# Patient Record
Sex: Female | Born: 1990 | Race: Black or African American | Hispanic: No | Marital: Single | Smoking: Never smoker
Health system: Southern US, Community
[De-identification: ages and names within clinical notes are randomized; demographics above are authoritative.]

## PROBLEM LIST (undated history)

## (undated) DIAGNOSIS — D219 Benign neoplasm of connective and other soft tissue, unspecified: Secondary | ICD-10-CM

---

## 2018-04-04 ENCOUNTER — Emergency Department (HOSPITAL_COMMUNITY): Payer: Self-pay

## 2018-04-04 ENCOUNTER — Emergency Department (HOSPITAL_COMMUNITY)
Admission: EM | Admit: 2018-04-04 | Discharge: 2018-04-04 | Disposition: A | Discharge status: Home / Self Care | Payer: Self-pay | Attending: Emergency Medicine | Admitting: Emergency Medicine

## 2018-04-04 ENCOUNTER — Encounter (HOSPITAL_COMMUNITY): Payer: Self-pay | Admitting: Emergency Medicine

## 2018-04-04 DIAGNOSIS — R112 Nausea with vomiting, unspecified: Secondary | ICD-10-CM

## 2018-04-04 DIAGNOSIS — R102 Pelvic and perineal pain: Secondary | ICD-10-CM | POA: Insufficient documentation

## 2018-04-04 DIAGNOSIS — N760 Acute vaginitis: Secondary | ICD-10-CM | POA: Insufficient documentation

## 2018-04-04 DIAGNOSIS — N83201 Unspecified ovarian cyst, right side: Secondary | ICD-10-CM

## 2018-04-04 DIAGNOSIS — B9689 Other specified bacterial agents as the cause of diseases classified elsewhere: Secondary | ICD-10-CM

## 2018-04-04 DIAGNOSIS — N83291 Other ovarian cyst, right side: Secondary | ICD-10-CM | POA: Insufficient documentation

## 2018-04-04 LAB — COMPREHENSIVE METABOLIC PANEL
ALT: 20 U/L (ref 0–44)
AST: 24 U/L (ref 15–41)
Albumin: 4.2 g/dL (ref 3.5–5.0)
Alkaline Phosphatase: 21 U/L — ABNORMAL LOW (ref 38–126)
Anion gap: 9 (ref 5–15)
BILIRUBIN TOTAL: 0.4 mg/dL (ref 0.3–1.2)
BUN: 13 mg/dL (ref 6–20)
CHLORIDE: 105 mmol/L (ref 98–111)
CO2: 26 mmol/L (ref 22–32)
CREATININE: 0.95 mg/dL (ref 0.44–1.00)
Calcium: 9 mg/dL (ref 8.9–10.3)
GFR calc non Af Amer: >60 mL/min (ref >60)
Glucose, Bld: 93 mg/dL (ref 70–99)
POTASSIUM: 3.7 mmol/L (ref 3.5–5.1)
Sodium: 140 mmol/L (ref 135–145)
Total Protein: 7.6 g/dL (ref 6.5–8.1)

## 2018-04-04 LAB — URINALYSIS, ROUTINE W REFLEX MICROSCOPIC
BILIRUBIN URINE: NEGATIVE
GLUCOSE, UA: NEGATIVE mg/dL
HGB URINE DIPSTICK: NEGATIVE
KETONES UR: NEGATIVE mg/dL
Leukocytes, UA: NEGATIVE
Nitrite: NEGATIVE
PH: 7 (ref 5.0–8.0)
Protein, ur: NEGATIVE mg/dL
SPECIFIC GRAVITY, URINE: 1.02 (ref 1.005–1.030)

## 2018-04-04 LAB — WET PREP, GENITAL
SPERM: NONE SEEN
Trich, Wet Prep: NONE SEEN
Yeast Wet Prep HPF POC: NONE SEEN

## 2018-04-04 LAB — CBC
HEMATOCRIT: 43.4 % (ref 36.0–46.0)
HEMOGLOBIN: 13.3 g/dL (ref 12.0–15.0)
MCH: 26.9 pg (ref 26.0–34.0)
MCHC: 30.6 g/dL (ref 30.0–36.0)
MCV: 87.9 fL (ref 80.0–100.0)
Platelets: 278 10*3/uL (ref 150–400)
RBC: 4.94 MIL/uL (ref 3.87–5.11)
RDW: 13.3 % (ref 11.5–15.5)
WBC: 13.4 10*3/uL — AB (ref 4.0–10.5)
nRBC: 0 % (ref 0.0–0.2)

## 2018-04-04 LAB — I-STAT BETA HCG BLOOD, ED (MC, WL, AP ONLY): I-stat hCG, quantitative: <5 m[IU]/mL (ref <5)

## 2018-04-04 LAB — LIPASE, BLOOD: LIPASE: 46 U/L (ref 11–51)

## 2018-04-04 MED ORDER — TRAMADOL HCL 50 MG PO TABS: 50.0000 mg | ORAL_TABLET | Freq: Four times a day (QID) | ORAL | 0 refills | Status: AC | PRN

## 2018-04-04 MED ORDER — ONDANSETRON 4 MG PO TBDP: 4.0000 mg | ORAL_TABLET | Freq: Three times a day (TID) | ORAL | 0 refills | Status: DC | PRN

## 2018-04-04 MED ORDER — METRONIDAZOLE 500 MG PO TABS: 500.0000 mg | ORAL_TABLET | Freq: Two times a day (BID) | ORAL | 0 refills | Status: DC

## 2018-04-04 MED ORDER — OXYCODONE-ACETAMINOPHEN 5-325 MG PO TABS
1.0000 | ORAL_TABLET | Freq: Once | ORAL | Status: AC
  Administered 2018-04-04: 1 via ORAL
  Filled 2018-04-04: qty 1

## 2018-04-04 NOTE — ED Triage Notes (Signed)
Per EMS, pt called for abdominal pain that started a few hours ago.  Pt reports emesis "for the past week, comes and goes."

## 2018-04-04 NOTE — ED Notes (Signed)
EDP at bedside  

## 2018-04-04 NOTE — ED Provider Notes (Signed)
[facility] Provider Note   CSN: [id number] Arrival date & time: 04/04/18  0342     History   Chief Complaint Chief Complaint  Patient presents with  . Abdominal Pain    HPI Kristine Barnett is a 27 y.o. female here for evaluation of abdominal pain.  Pain is described as sharp, cramping, severe.  Pain is localized to the lower abdomen initially worse in the right side but now diffusely all over in the lower abdomen, pain radiates and shoots into the vagina.  Pain is worse when she moves or sits up.  Pain began 5 minutes after having sexual intercourse, approximately 3 hours prior to arrival.  Associated symptoms include nausea and nonbilious nonbloody intermittent vomiting for 1 week.  History of urinary frequency for a long time.  Additionally, has noticed increased thicker vaginal discharge with no odor.  No previous similar pains in the past.  LMP September 15.  She is sexually active with one female partner without condoms.  History of STDs many years ago.  Denies associated fevers, chills, hematemesis, changes in bowel movements, blood in stool, hematuria, dysuria.  No previous abdominal or pelvic surgeries.  No interventions PTA.  Alleviated by sitting still, not moving.   HPI  History reviewed. No pertinent past medical history.  There are no active problems to display for this patient.   History reviewed. No pertinent surgical history.   OB History   None      Home Medications    Prior to Admission medications   Medication Sig Start Date End Date Taking? Authorizing Provider  metroNIDAZOLE (FLAGYL) 500 MG tablet Take 1 tablet (500 mg total) by mouth 2 (two) times daily. 04/04/18   Kinnie Feil, PA-C  ondansetron (ZOFRAN ODT) 4 MG disintegrating tablet Take 1 tablet (4 mg total) by mouth every 8 (eight) hours as needed for nausea or vomiting. 04/04/18   Kinnie Feil, PA-C  traMADol (ULTRAM) 50 MG tablet Take 1 tablet (50  mg total) by mouth every 6 (six) hours as needed for up to 2 days. 04/04/18 04/06/18  Kinnie Feil, PA-C    Family History No family history on file.  Social History Social History   Tobacco Use  . Smoking status: Not on file  Substance Use Topics  . Alcohol use: Not on file  . Drug use: Not on file     Allergies   Patient has no allergy information on record.   Review of Systems Review of Systems  Gastrointestinal: Positive for abdominal pain, nausea and vomiting.  Genitourinary: Positive for dyspareunia, vaginal discharge and vaginal pain.  All other systems reviewed and are negative.    Physical Exam Updated Vital Signs BP 104/69 (BP Location: Right Arm)   Pulse 74   Temp 98.7 F (37.1 C) (Oral)   Resp 16   Ht 5\' 6"  (1.676 m)   SpO2 94%   Physical Exam  Constitutional: She is oriented to person, place, and time. She appears well-developed and well-nourished.  Non toxic. Looks uncomfortable, complains of pain with movement and sitting up.   HENT:  Head: Normocephalic and atraumatic.  Nose: Nose normal.  Eyes: Pupils are equal, round, and reactive to light. Conjunctivae and EOM are normal.  Neck: Normal range of motion.  Cardiovascular: Normal rate and regular rhythm.  Pulmonary/Chest: Effort normal and breath sounds normal.  Abdominal: Soft. Bowel sounds are normal. There is tenderness.  Moderate tenderness to very low abdomen worse on  R, suprapubic region. No G/R/R. Active bowel sounds to lower quadrants. Negative Murphy's and Mcburney's. No CVAT.   Genitourinary: Cervix exhibits motion tenderness. Right adnexum displays tenderness.  Genitourinary Comments:  RN at bedside during exam External genitalia normal without erythema, tenderness, discharge or lesions.  No groin lymphadenopathy.  Vaginal mucosa pink without lesions, injury or bleeding.  Cervix is closed w/o friability and only scant amount of white/gray watery discharge, slight fishy odor  noted.  Mild CMT and R adnexal tenderness w/o fullness. Non palpable, non tender L adnexa. Perianal skin normal  Musculoskeletal: Normal range of motion.  Neurological: She is alert and oriented to person, place, and time.  Skin: Skin is warm and dry. Capillary refill takes less than 2 seconds.  Psychiatric: She has a normal mood and affect. Her behavior is normal.  Nursing note and vitals reviewed.    ED Treatments / Results  Labs (all labs ordered are listed, but only abnormal results are displayed) Labs Reviewed  WET PREP, GENITAL - Abnormal; Notable for the following components:      Result Value   Clue Cells Wet Prep HPF POC PRESENT (*)    WBC, Wet Prep HPF POC MODERATE (*)    All other components within normal limits  COMPREHENSIVE METABOLIC PANEL - Abnormal; Notable for the following components:   Alkaline Phosphatase 21 (*)    All other components within normal limits  CBC - Abnormal; Notable for the following components:   WBC 13.4 (*)    All other components within normal limits  LIPASE, BLOOD  URINALYSIS, ROUTINE W REFLEX MICROSCOPIC  I-STAT BETA HCG BLOOD, ED ([facility], [facility], [facility] ONLY)  GC/CHLAMYDIA PROBE AMP ([place]) NOT AT [facility]    EKG None  Radiology US Transvaginal Non-ob  Result Date: 04/04/2018 CLINICAL DATA:  pain/dyspareunia EXAM: TRANSABDOMINAL AND TRANSVAGINAL ULTRASOUND OF PELVIS DOPPLER ULTRASOUND OF OVARIES TECHNIQUE: Study was performed transabdominally to optimize pelvic field of view evaluation and transvaginally to optimize internal visceral architecture evaluation. Color and duplex Doppler ultrasound was utilized to evaluate blood flow to the ovaries. COMPARISON:  None. FINDINGS: Uterus Measurements: 8.7 x 3.9 x 5.3 cm. No fibroids or other mass visualized. Endometrium Thickness: 15 mm.  No focal abnormality visualized. Right ovary Measurements: 6.1 x 3.5 x 3.9 cm. There is a somewhat complex right ovarian mass measuring 2.5 x 1.9 x 2.5 cm. There is  also a simple cyst arising from the right ovary, a likely follicle, measuring 2.5 x 1.9 x 2.5 cm. Left ovary Measurements: 3.4 x 2.6 x 3.1 cm. Normal appearance/no adnexal mass. Pulsed Doppler evaluation of both ovaries demonstrates normal low-resistance arterial and venous waveforms. Other findings Small amount of free fluid. IMPRESSION: 1. Complex right ovarian region mass measuring 2.5 x 1.9 x 2.5 cm. Suspect hemorrhagic cyst. Short-interval follow up ultrasound in 6-12 weeks is recommended, preferably during the week following the patient's normal menses. Probable dominant follicle also noted on the right. 2.  Normal appearing left ovary. 3. Small amount of free pelvic fluid. Question recent ovarian cyst leakage/rupture. 4. Doppler signal noted in each ovary. There are no findings which are felt to be indicative of acute ovarian torsion. 5.  Uterus and endometrium appear within normal limits for age. Electronically Signed   By: Lowella Grip III M.D.   On: 04/04/2018 08:37   US Pelvis Complete  Result Date: 04/04/2018 CLINICAL DATA:  pain/dyspareunia EXAM: TRANSABDOMINAL AND TRANSVAGINAL ULTRASOUND OF PELVIS DOPPLER ULTRASOUND OF OVARIES TECHNIQUE: Study was performed transabdominally to  optimize pelvic field of view evaluation and transvaginally to optimize internal visceral architecture evaluation. Color and duplex Doppler ultrasound was utilized to evaluate blood flow to the ovaries. COMPARISON:  None. FINDINGS: Uterus Measurements: 8.7 x 3.9 x 5.3 cm. No fibroids or other mass visualized. Endometrium Thickness: 15 mm.  No focal abnormality visualized. Right ovary Measurements: 6.1 x 3.5 x 3.9 cm. There is a somewhat complex right ovarian mass measuring 2.5 x 1.9 x 2.5 cm. There is also a simple cyst arising from the right ovary, a likely follicle, measuring 2.5 x 1.9 x 2.5 cm. Left ovary Measurements: 3.4 x 2.6 x 3.1 cm. Normal appearance/no adnexal mass. Pulsed Doppler evaluation of both ovaries  demonstrates normal low-resistance arterial and venous waveforms. Other findings Small amount of free fluid. IMPRESSION: 1. Complex right ovarian region mass measuring 2.5 x 1.9 x 2.5 cm. Suspect hemorrhagic cyst. Short-interval follow up ultrasound in 6-12 weeks is recommended, preferably during the week following the patient's normal menses. Probable dominant follicle also noted on the right. 2.  Normal appearing left ovary. 3. Small amount of free pelvic fluid. Question recent ovarian cyst leakage/rupture. 4. Doppler signal noted in each ovary. There are no findings which are felt to be indicative of acute ovarian torsion. 5.  Uterus and endometrium appear within normal limits for age. Electronically Signed   By: Lowella Grip III M.D.   On: 04/04/2018 08:37   [place] Art/ven Flow Abd Pelv Doppler  Result Date: 04/04/2018 CLINICAL DATA:  pain/dyspareunia EXAM: TRANSABDOMINAL AND TRANSVAGINAL ULTRASOUND OF PELVIS DOPPLER ULTRASOUND OF OVARIES TECHNIQUE: Study was performed transabdominally to optimize pelvic field of view evaluation and transvaginally to optimize internal visceral architecture evaluation. Color and duplex Doppler ultrasound was utilized to evaluate blood flow to the ovaries. COMPARISON:  None. FINDINGS: Uterus Measurements: 8.7 x 3.9 x 5.3 cm. No fibroids or other mass visualized. Endometrium Thickness: 15 mm.  No focal abnormality visualized. Right ovary Measurements: 6.1 x 3.5 x 3.9 cm. There is a somewhat complex right ovarian mass measuring 2.5 x 1.9 x 2.5 cm. There is also a simple cyst arising from the right ovary, a likely follicle, measuring 2.5 x 1.9 x 2.5 cm. Left ovary Measurements: 3.4 x 2.6 x 3.1 cm. Normal appearance/no adnexal mass. Pulsed Doppler evaluation of both ovaries demonstrates normal low-resistance arterial and venous waveforms. Other findings Small amount of free fluid. IMPRESSION: 1. Complex right ovarian region mass measuring 2.5 x 1.9 x 2.5 cm. Suspect hemorrhagic  cyst. Short-interval follow up ultrasound in 6-12 weeks is recommended, preferably during the week following the patient's normal menses. Probable dominant follicle also noted on the right. 2.  Normal appearing left ovary. 3. Small amount of free pelvic fluid. Question recent ovarian cyst leakage/rupture. 4. Doppler signal noted in each ovary. There are no findings which are felt to be indicative of acute ovarian torsion. 5.  Uterus and endometrium appear within normal limits for age. Electronically Signed   By: Lowella Grip III M.D.   On: 04/04/2018 08:37    Procedures Procedures (including critical care time)  Medications Ordered in ED Medications  oxyCODONE-acetaminophen (PERCOCET/ROXICET) 5-325 MG per tablet 1 tablet (1 tablet Oral Given 04/04/18 0730)     Initial Impression / Assessment and Plan / ED Course  I have reviewed the triage vital signs and the nursing notes.  Pertinent labs & imaging results that were available during my care of the patient were reviewed by me and considered in my medical decision making (see chart  for details).  Clinical Course as of Apr 04 904  Fri Apr 04, 2018  2841 IMPRESSION: 1. Complex right ovarian region mass measuring 2.5 x 1.9 x 2.5 cm. Suspect hemorrhagic cyst. Short-interval follow up ultrasound in 6-12 weeks is recommended, preferably during the week following the patient's normal menses. Probable dominant follicle also noted on the right.  2. Normal appearing left ovary.  3. Small amount of free pelvic fluid. Question recent ovarian cyst leakage/rupture.  4. Doppler signal noted in each ovary. There are no findings which are felt to be indicative of acute ovarian torsion.  5. Uterus and endometrium appear within normal limits for age.  US Pelvis Complete [CG]  0857 Clue Cells Wet Prep HPF POC(!): PRESENT [CG]    Clinical Course User Index [CG] Kinnie Feil, PA-C    Highest on ddx is GU etiology such as ovarian cyst  versus PID versus ectopic pregnancy.  Given description of pain on the right side also considering appendicitis however this is less likely as patient has had nausea and vomiting but no abdominal pain for a week and appendicitis would be unlikely for such a long time.  Pain actually began immediately after sexual intercourse which suggest more of a pelvic etiology.  Doubt diverticulitis.   0900: WB 313.4.  Moderate WBC and clue cells present on wet prep.  Negative pregnancy test.  Pelvic exam as above with very mild CMT and more moderate right adnexal tenderness without fullness.  No intravaginal signs of injury, bleed.  Positive whiff test suggesting BV.  Pelvic ultrasound shows a complex right ovarian mass with surrounding free pelvic fluid suggestive of hemorrhagic cyst.  This can explain patient's symptoms and fits clinical picture.  Reevaluated patient and her abdominal pain is mostly lower, pelvic, much lower than at McBurney's point.  Given clinical picture, exam, ultrasound I think appendicitis is unlikely.  I explained results to patient and that appendicitis and other GI etiology is unlikely.  She verbalized understanding and also agrees.  She has had no fevers, BM changes, urinary symptoms otherwise.  At this time, will discharge with symptomatic management of pelvic pain likely secondary to ruptured hemorrhagic cyst, Zofran and Flagyl for BV.  I discussed specific return precautions that would suggest more of a GI etiology including appendicitis and kidney stone and patient verbalized understanding.  She is trying to get pregnant and she is aware she needs to follow-up with repeat ultrasound in 6 to 8 weeks to monitor of that right ovarian mass/cyst.  Follow-up with OB/GYN.  Final Clinical Impressions(s) / ED Diagnoses   Final diagnoses:  Pelvic pain  Hemorrhagic cyst of right ovary  Nausea and vomiting in adult  Bacterial vaginosis    ED Discharge Orders         Ordered    traMADol  (ULTRAM) 50 MG tablet  Every 6 hours PRN     04/04/18 0854    ondansetron (ZOFRAN ODT) 4 MG disintegrating tablet  Every 8 hours PRN     04/04/18 0854    metroNIDAZOLE (FLAGYL) 500 MG tablet  2 times daily     04/04/18 0858           Kinnie Feil, PA-C 04/04/18 3244    Ripley Fraise, MD 04/05/18 0002

## 2018-04-04 NOTE — Discharge Instructions (Addendum)
You were seen in the ER for sudden pelvic pain.  Ultrasound showed a small "somewhat complex" right ovarian mass favored to be a recently ruptured hemorrhagic cyst.  Radiology is recommending repeat ultrasound in 6 to 12 weeks to evaluate for changes.  I suspect your symptoms are from a recently ruptured cyst in your right ovary.  We will treat this with 1000 mg of acetaminophen and/or 600 mg of ibuprofen every 6-8 hours for pain.  Take tramadol 50 mg only for breakthrough or severe pain.  Tramadol can cause constipation and mild nausea, can take with food and stool softener to avoid side effects.  We discussed the possibility of appendicitis or kidney stone is unlikely.  Monitor your symptoms.  Return to the ER if there is worsening localized pain to the right low abdomen, persistent nausea, vomiting, fevers, chills, urinary symptoms.    Swabs showed bacterial vaginosis which can explain recent increase in discharge.  Take metronidazole as prescribed for 7 days, do not drink alcohol during this medication.

## 2018-04-07 LAB — GC/CHLAMYDIA PROBE AMP (~~LOC~~) NOT AT ARMC
Chlamydia: NEGATIVE
Neisseria Gonorrhea: NEGATIVE

## 2019-06-15 IMAGING — US US PELVIS COMPLETE
1 series · 13 of 25 positions shown · non-contrast
Comparison: None.

CLINICAL DATA: pain/dyspareunia

EXAM:
TRANSABDOMINAL AND TRANSVAGINAL ULTRASOUND OF PELVIS
DOPPLER ULTRASOUND OF OVARIES
TECHNIQUE: Study was performed transabdominally to optimize pelvic field of
view evaluation and transvaginally to optimize internal visceral
architecture evaluation.
Color and duplex Doppler ultrasound was utilized to evaluate blood
flow to the ovaries.

[Series 1: us pelvis complete · 0.22mm/px · 13 of 101 slices shown]
[im 1/101]
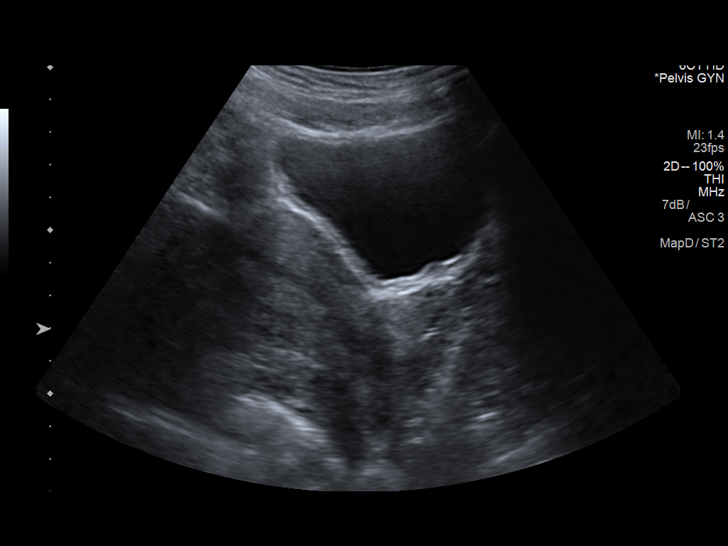
[im 9/101]
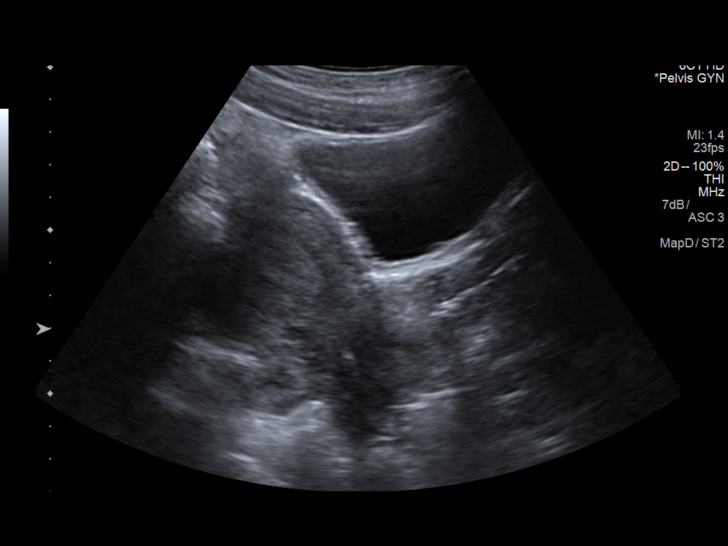
[im 17/101]
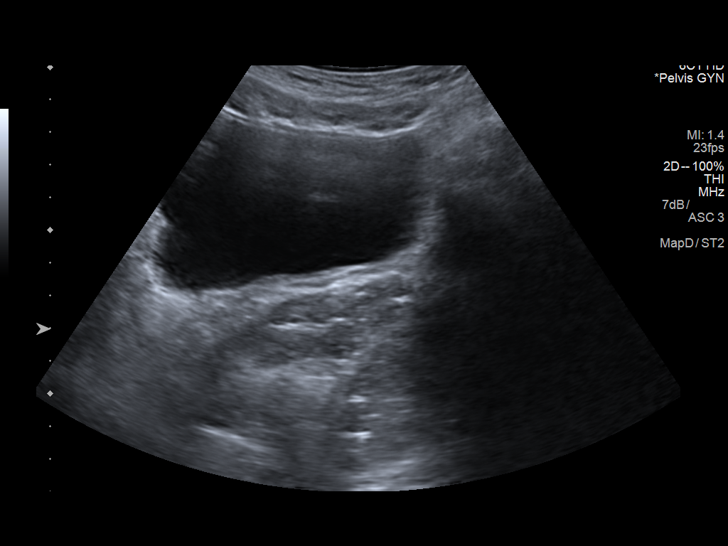
[im 26/101]
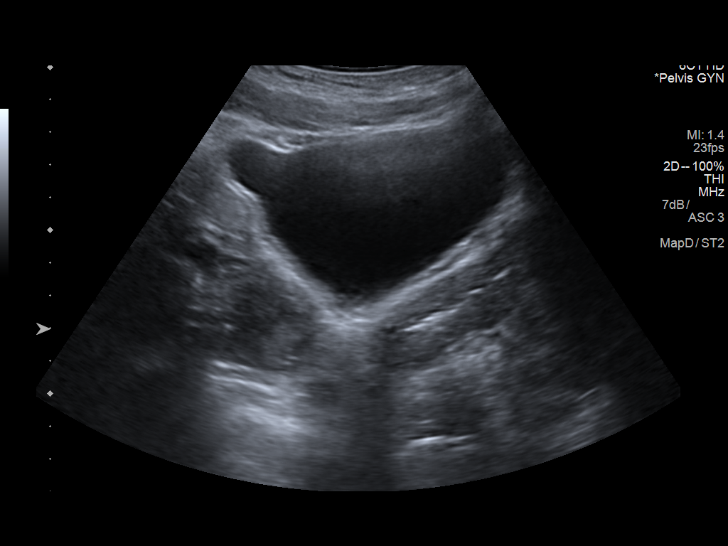
[im 34/101]
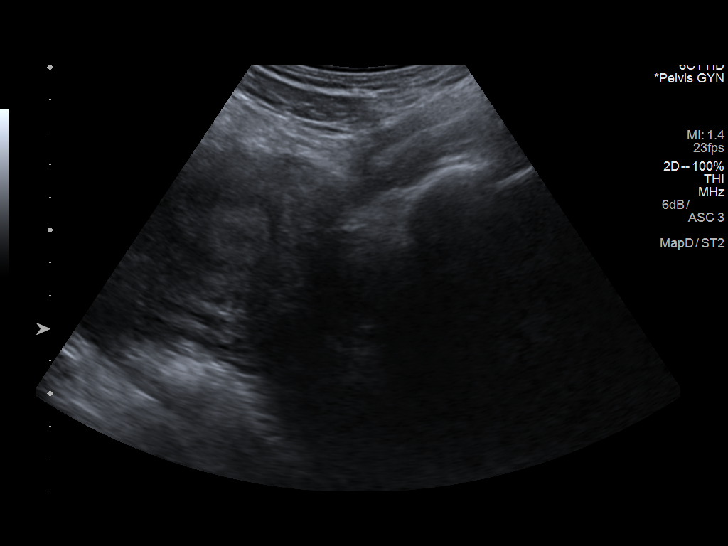
[im 42/101]
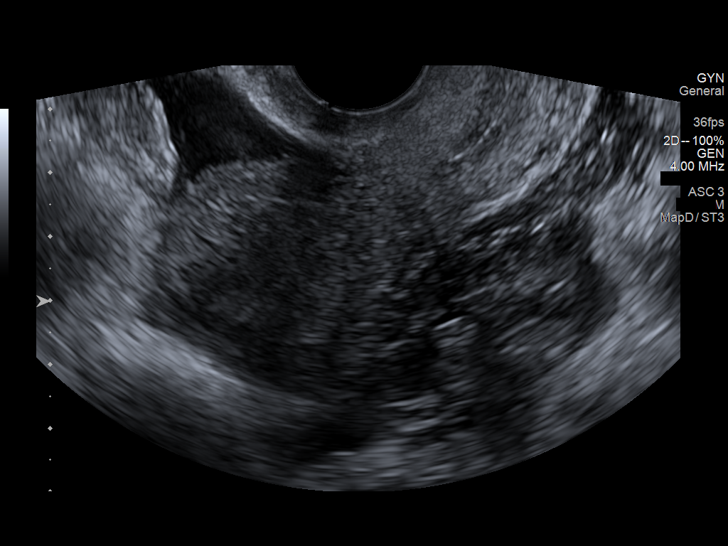
[im 51/101]
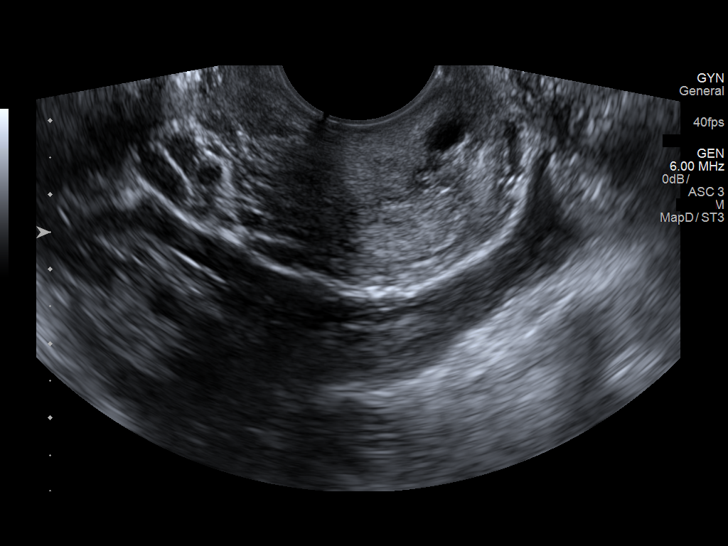
[im 59/101]
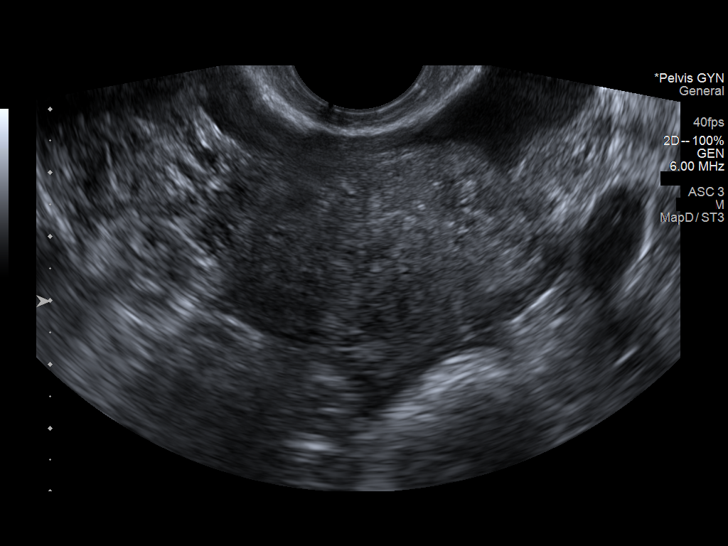
[im 67/101]
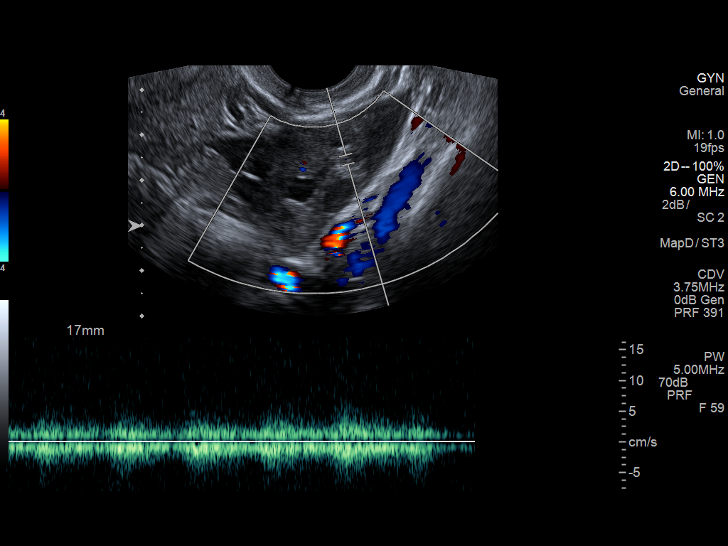
[im 76/101]
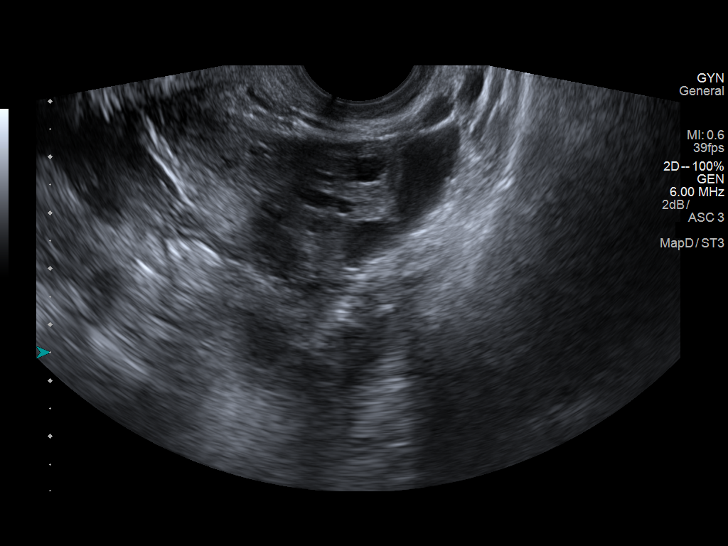
[im 84/101]
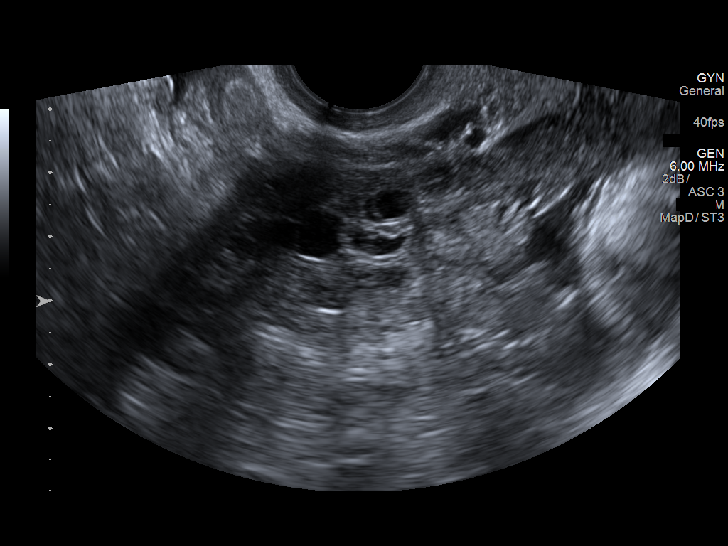
[im 92/101]
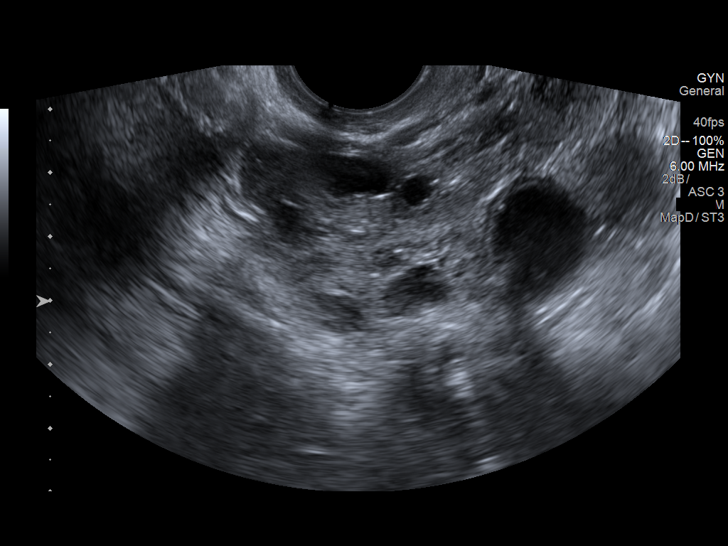
[im 101/101]
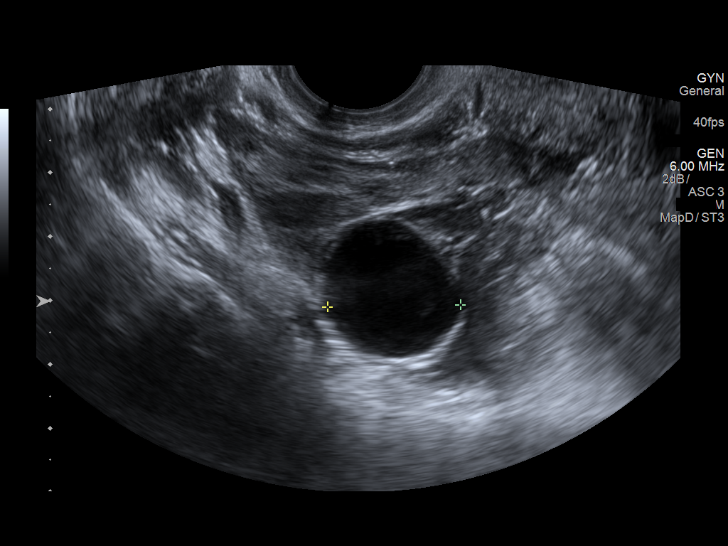

[13 of 25 positions shown; findings below may reference images not displayed]

FINDINGS: Uterus

Measurements: 8.7 x 3.9 x 5.3 cm. No fibroids or other mass
visualized.

Endometrium

Thickness: 15 mm.  No focal abnormality visualized.

Right ovary

Measurements: 6.1 x 3.5 x 3.9 cm. There is a somewhat complex right
ovarian mass measuring 2.5 x 1.9 x 2.5 cm. There is also a simple
cyst arising from the right ovary, a likely follicle, measuring
x 1.9 x 2.5 cm.

Left ovary

Measurements: 3.4 x 2.6 x 3.1 cm. Normal appearance/no adnexal mass.

Pulsed Doppler evaluation of both ovaries demonstrates normal
low-resistance arterial and venous waveforms.

Other findings

Small amount of free fluid.
IMPRESSION: 1. Complex right ovarian region mass measuring 2.5 x 1.9 x 2.5 cm.
Suspect hemorrhagic cyst. Short-interval follow up ultrasound in
6-12 weeks is recommended, preferably during the week following the
patient's normal menses. Probable dominant follicle also noted on
the right.

2.  Normal appearing left ovary.

3. Small amount of free pelvic fluid. Question recent ovarian cyst
leakage/rupture.

4. Doppler signal noted in each ovary. There are no findings which
are felt to be indicative of acute ovarian torsion.

5.  Uterus and endometrium appear within normal limits for age.

## 2019-07-22 ENCOUNTER — Emergency Department (HOSPITAL_COMMUNITY)
Admission: EM | Admit: 2019-07-22 | Discharge: 2019-07-22 | Disposition: A | Discharge status: Home / Self Care | Payer: Self-pay | Attending: Emergency Medicine | Admitting: Emergency Medicine

## 2019-07-22 ENCOUNTER — Encounter: Payer: Self-pay | Admitting: Family Medicine

## 2019-07-22 ENCOUNTER — Other Ambulatory Visit: Payer: Self-pay

## 2019-07-22 DIAGNOSIS — E860 Dehydration: Secondary | ICD-10-CM

## 2019-07-22 DIAGNOSIS — R748 Abnormal levels of other serum enzymes: Secondary | ICD-10-CM

## 2019-07-22 DIAGNOSIS — R55 Syncope and collapse: Secondary | ICD-10-CM

## 2019-07-22 DIAGNOSIS — R079 Chest pain, unspecified: Secondary | ICD-10-CM | POA: Insufficient documentation

## 2019-07-22 LAB — COMPREHENSIVE METABOLIC PANEL
ALT: 14 U/L (ref 0–44)
AST: 14 U/L — ABNORMAL LOW (ref 15–41)
Albumin: 3.9 g/dL (ref 3.5–5.0)
Alkaline Phosphatase: 20 U/L — ABNORMAL LOW (ref 38–126)
Anion gap: 7 (ref 5–15)
BUN: 11 mg/dL (ref 6–20)
CO2: 25 mmol/L (ref 22–32)
Calcium: 8.2 mg/dL — ABNORMAL LOW (ref 8.9–10.3)
Chloride: 106 mmol/L (ref 98–111)
Creatinine, Ser: 0.87 mg/dL (ref 0.44–1.00)
GFR calc Af Amer: >60 mL/min (ref >60)
GFR calc non Af Amer: >60 mL/min (ref >60)
Glucose, Bld: 82 mg/dL (ref 70–99)
Potassium: 4 mmol/L (ref 3.5–5.1)
Sodium: 138 mmol/L (ref 135–145)
Total Bilirubin: 0.5 mg/dL (ref 0.3–1.2)
Total Protein: 6.8 g/dL (ref 6.5–8.1)

## 2019-07-22 LAB — CBC WITH DIFFERENTIAL/PLATELET
Abs Immature Granulocytes: 0.04 10*3/uL (ref 0.00–0.07)
Basophils Absolute: 0 10*3/uL (ref 0.0–0.1)
Basophils Relative: 0 %
Eosinophils Absolute: 0.1 10*3/uL (ref 0.0–0.5)
Eosinophils Relative: 1 %
HCT: 38 % (ref 36.0–46.0)
Hemoglobin: 12 g/dL (ref 12.0–15.0)
Immature Granulocytes: 1 %
Lymphocytes Relative: 23 %
Lymphs Abs: 1.8 10*3/uL (ref 0.7–4.0)
MCH: 27.6 pg (ref 26.0–34.0)
MCHC: 31.6 g/dL (ref 30.0–36.0)
MCV: 87.4 fL (ref 80.0–100.0)
Monocytes Absolute: 0.5 10*3/uL (ref 0.1–1.0)
Monocytes Relative: 7 %
Neutro Abs: 5.4 10*3/uL (ref 1.7–7.7)
Neutrophils Relative %: 68 %
Platelets: 246 10*3/uL (ref 150–400)
RBC: 4.35 MIL/uL (ref 3.87–5.11)
RDW: 15.2 % (ref 11.5–15.5)
WBC: 7.9 10*3/uL (ref 4.0–10.5)
nRBC: 0 % (ref 0.0–0.2)

## 2019-07-22 LAB — TSH: TSH: 0.184 u[IU]/mL — ABNORMAL LOW (ref 0.350–4.500)

## 2019-07-22 LAB — LIPASE, BLOOD: Lipase: 161 U/L — ABNORMAL HIGH (ref 11–51)

## 2019-07-22 LAB — TROPONIN I (HIGH SENSITIVITY): Troponin I (High Sensitivity): <2 ng/L (ref <18)

## 2019-07-22 LAB — MAGNESIUM: Magnesium: 2.1 mg/dL (ref 1.7–2.4)

## 2019-07-22 MED ORDER — SODIUM CHLORIDE 0.9 % IV BOLUS
1000.0000 mL | Freq: Once | INTRAVENOUS | Status: AC
  Administered 2019-07-22: 1000 mL via INTRAVENOUS

## 2019-07-22 NOTE — ED Notes (Addendum)
Spoke with main lab about the status of her labs. Lab states they don't have her blood. Asked Dee, NT to stick because I don't think her IV will draw back. Dr. Sherry Ruffing is aware.

## 2019-07-22 NOTE — ED Triage Notes (Signed)
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 Patient started experiencing dizziness, diaphoretic, and warm. She got into a chair and had a syncopal episode for several seconds. Patient is alert, oriented x 4, and appears in no distress.

## 2019-07-22 NOTE — Discharge Instructions (Signed)
Your work-up today was overall reassuring but we do suspect you are dehydrated.  Your lipase was slightly elevated likely related to the alcohol intake and your previous pancreatitis troubles.  Please avoid alcohol if you can.  Please rest and stay hydrated.  Please follow-up with your primary doctor and if any symptoms change or worsen, please return to the nearest emergency department.

## 2019-07-22 NOTE — ED Provider Notes (Signed)
[facility] DEPT Provider Note   CSN: [id number] Arrival date & time: 07/22/19  1823     History Chief Complaint  Patient presents with  . Loss of Consciousness    Niamh Pinela is a 29 y.o. female.  The history is provided by the patient and medical records. No language interpreter was used.  Loss of Consciousness Episode history:  Single Most recent episode:  Today Timing:  Rare Progression:  Improving Chronicity:  Recurrent Context: standing up   Witnessed: yes   Relieved by:  Nothing Worsened by:  Nothing Ineffective treatments:  None tried Associated symptoms: malaise/fatigue   Associated symptoms: no anxiety, no chest pain (not today but had brief CP several days ago), no diaphoresis, no difficulty breathing, no dizziness, no fever, no focal weakness, no headaches, no nausea, no palpitations, no recent fall, no recent injury, no rectal bleeding, no seizures, no shortness of breath, no visual change, no vomiting and no weakness   Risk factors: no congenital heart disease, no coronary artery disease and no seizures        No past medical history on file.  There are no problems to display for this patient.   No past surgical history on file.   OB History   No obstetric history on file.     No family history on file.  Social History   Tobacco Use  . Smoking status: Not on file  Substance Use Topics  . Alcohol use: Not on file  . Drug use: Not on file    Home Medications Prior to Admission medications   Medication Sig Start Date End Date Taking? Authorizing Provider  metroNIDAZOLE (FLAGYL) 500 MG tablet Take 1 tablet (500 mg total) by mouth 2 (two) times daily. 04/04/18   Kinnie Feil, PA-C  ondansetron (ZOFRAN ODT) 4 MG disintegrating tablet Take 1 tablet (4 mg total) by mouth every 8 (eight) hours as needed for nausea or vomiting. 04/04/18   Kinnie Feil, PA-C    Allergies    Patient has no allergy  information on record.  Review of Systems   Review of Systems  Constitutional: Positive for malaise/fatigue. Negative for chills, diaphoresis, fatigue and fever.  HENT: Negative for congestion.   Respiratory: Negative for cough, chest tightness, shortness of breath and wheezing.   Cardiovascular: Positive for syncope. Negative for chest pain (not today but had brief CP several days ago), palpitations and leg swelling.  Gastrointestinal: Negative for abdominal pain, constipation, diarrhea, nausea and vomiting.  Genitourinary: Positive for vaginal bleeding (several days ago, now resolved). Negative for dysuria, flank pain, frequency, vaginal discharge and vaginal pain.  Musculoskeletal: Negative for back pain.  Neurological: Positive for syncope and light-headedness. Negative for dizziness, focal weakness, seizures, speech difficulty, weakness and headaches.  Psychiatric/Behavioral: Negative for agitation.  All other systems reviewed and are negative.   Physical Exam Updated Vital Signs BP 90/60 (BP Location: Right Arm)   Pulse 80   Temp 98.7 F (37.1 C) (Oral)   Resp 14   Ht 5\' 6"  (1.676 m)   Wt 60.3 kg   SpO2 99%   BMI 21.47 kg/m   Physical Exam Vitals and nursing note reviewed.  Constitutional:      General: She is not in acute distress.    Appearance: Normal appearance. She is well-developed and normal weight. She is not ill-appearing, toxic-appearing or diaphoretic.  HENT:     Head: Normocephalic and atraumatic.     Right Ear: External ear normal.  Left Ear: External ear normal.     Nose: Nose normal. No congestion or rhinorrhea.     Mouth/Throat:     Mouth: Mucous membranes are moist.     Pharynx: Oropharynx is clear. No oropharyngeal exudate or posterior oropharyngeal erythema.  Eyes:     Extraocular Movements: Extraocular movements intact.     Conjunctiva/sclera: Conjunctivae normal.     Pupils: Pupils are equal, round, and reactive to light.  Cardiovascular:      Rate and Rhythm: Normal rate and regular rhythm.     Pulses: Normal pulses.     Heart sounds: No murmur.  Pulmonary:     Effort: Pulmonary effort is normal. No respiratory distress.     Breath sounds: No stridor. No wheezing, rhonchi or rales.  Chest:     Chest wall: No tenderness.  Abdominal:     General: Abdomen is flat. There is no distension.     Tenderness: There is no abdominal tenderness. There is no right CVA tenderness, left CVA tenderness or rebound.  Musculoskeletal:        General: No tenderness.     Cervical back: Normal range of motion and neck supple. No tenderness.     Right lower leg: No edema.     Left lower leg: No edema.  Skin:    General: Skin is warm.     Capillary Refill: Capillary refill takes less than 2 seconds.     Coloration: Skin is pale (pale mucous membranes).     Findings: No erythema or rash.  Neurological:     General: No focal deficit present.     Mental Status: She is alert and oriented to person, place, and time.     Cranial Nerves: No cranial nerve deficit.     Sensory: No sensory deficit.     Motor: No weakness or abnormal muscle tone.     Coordination: Coordination normal.     Deep Tendon Reflexes: Reflexes are normal and symmetric.  Psychiatric:        Mood and Affect: Mood normal.     ED Results / Procedures / Treatments   Labs (all labs ordered are listed, but only abnormal results are displayed) Labs Reviewed  COMPREHENSIVE METABOLIC PANEL - Abnormal; Notable for the following components:      Result Value   Calcium 8.2 (*)    AST 14 (*)    Alkaline Phosphatase 20 (*)    All other components within normal limits  LIPASE, BLOOD - Abnormal; Notable for the following components:   Lipase 161 (*)    All other components within normal limits  TSH - Abnormal; Notable for the following components:   TSH 0.184 (*)    All other components within normal limits  CBC WITH DIFFERENTIAL/PLATELET  MAGNESIUM  TROPONIN I (HIGH  SENSITIVITY)    EKG EKG Interpretation  Date/Time:  Wednesday July 22 2019 18:32:31 EST Ventricular Rate:  80 PR Interval:    QRS Duration: 70 QT Interval:  352 QTC Calculation: 406 R Axis:   86 Text Interpretation: Sinus rhythm Borderline short PR interval Right atrial enlargement no prior ECG for comparison. sharp P wave. no convincing delta wave. No STEMI Confirmed by Antony Blackbird [phone]) on 07/22/2019 6:40:26 PM   Radiology No results found.  Procedures Procedures (including critical care time)  Medications Ordered in ED Medications  sodium chloride 0.9 % bolus 1,000 mL (0 mLs Intravenous Stopped 07/22/19 2125)    ED Course  I have reviewed the  triage vital signs and the nursing notes.  Pertinent labs & imaging results that were available during my care of the patient were reviewed by me and considered in my medical decision making (see chart for details).    MDM Rules/Calculators/A&P                      Mallissa Anez is a 29 y.o. female with a past medical history significant for uterine fibroids with dysfunctional uterine bleeding recently, currently being treated with Flagyl for trichomonas, and prior syncope who presents with syncopal episode.  Patient reports that several days ago she had heavy vaginal bleeding and was seen by PCP who diagnosed her with fibroids and her anemia was mild at 11.9.  She says that her vaginal bleeding has stopped and she has been taking the Flagyl starting yesterday for trichomonas which was discovered.  She says that she has not been drinking as much fluids as normal.  She says that today she was feeling slightly lightheaded but then after getting to work and standing at a counter, she got very hot and warm and lightheaded feeling.  Patient was able to get into a chair and did not fall or hit her head.  She thinks she passed out for several seconds.  She denies any associated palpitations chest pain or shortness of breath.  She denies  any nausea, vomiting, or changes with urination or bowel movements.  She denies any Covid contacts.  She says that she passed out several years ago when she had not eaten but has not had any of these episodes since.  She has no family history of early cardiac death or early heart disease.  She still feels "off" and slightly lightheaded.  She does report that several days ago she had several seconds of sharp chest pain in her central right chest that came and went.  She denies any further symptoms with that.  On exam, chest is nontender.  Lungs are clear.  No focal neurologic deficits.  Abdomen nontender.  No murmur.  Good pulses in all extremities.  Patient's blood pressure was 90/60 while at rest and sitting.  Clinically suspect she had a orthostatic syncopal episode with dehydration and low blood pressures.  However I am concerned for possible anemia given the recent vaginal bleeding and her slight pallor on mucous membranes on exam.  Will get screening labs as well as monitor her on telemetry.  We will give her fluids.  If work-up is reassuring and she is feeling better and she is no longer orthostatic or hypotensive, dissipate discharge home after reassuring work-up.  EKG shows no STEMI but does show slightly shortened PR with a tall P waves.  Low suspicion for WPW based on appearance of QRS.  Anticipate reassessment after rehydration and monitoring.  11:34 PM After fluid, blood pressure improved and she is feeling much better.  No further lightheadedness or feeling like she is near syncopal.  Blood work was overall reassuring although lipase was elevated.  Patient is not more anemic.    Patient reports that she has had pancreatitis previously and recently started drinking again.  She was advised to avoid this and stay hydrated.  No evidence of acute cardiac abnormality and troponin was negative.  Patient agrees with plan of care and will be discharged.  Patient will follow up with PCP and  understood return precautions.  Patient discharged in good condition.    Final Clinical Impression(s) / ED Diagnoses Final diagnoses:  Syncope, unspecified syncope type  Dehydration  Elevated lipase    Rx / DC Orders ED Discharge Orders    None      Clinical Impression: 1. Syncope, unspecified syncope type   2. Dehydration   3. Elevated lipase     Disposition: Discharge  Condition: Good  I have discussed the results, Dx and Tx plan with the pt(& family if present). He/she/they expressed understanding and agree(s) with the plan. Discharge instructions discussed at great length. Strict return precautions discussed and pt &/or family have verbalized understanding of the instructions. No further questions at time of discharge.    New Prescriptions   No medications on file    Follow Up: [facility] [address] [place] [phone] Schedule an appointment as soon as possible for a visit    [facility] DEPT [place] [id number] mc [place]       Lakeishia Truluck, Gwenyth Allegra, MD 07/22/19 [phone]

## 2020-01-28 ENCOUNTER — Other Ambulatory Visit: Payer: Self-pay

## 2020-01-28 ENCOUNTER — Emergency Department (HOSPITAL_COMMUNITY)
Admission: EM | Admit: 2020-01-28 | Discharge: 2020-01-28 | Disposition: A | Discharge status: Home / Self Care | Payer: Self-pay | Attending: Emergency Medicine | Admitting: Emergency Medicine

## 2020-01-28 ENCOUNTER — Encounter (HOSPITAL_COMMUNITY): Payer: Self-pay | Admitting: *Deleted

## 2020-01-28 DIAGNOSIS — R42 Dizziness and giddiness: Secondary | ICD-10-CM | POA: Insufficient documentation

## 2020-01-28 DIAGNOSIS — E860 Dehydration: Secondary | ICD-10-CM | POA: Insufficient documentation

## 2020-01-28 DIAGNOSIS — R197 Diarrhea, unspecified: Secondary | ICD-10-CM | POA: Insufficient documentation

## 2020-01-28 DIAGNOSIS — R112 Nausea with vomiting, unspecified: Secondary | ICD-10-CM | POA: Insufficient documentation

## 2020-01-28 DIAGNOSIS — Z20822 Contact with and (suspected) exposure to covid-19: Secondary | ICD-10-CM | POA: Insufficient documentation

## 2020-01-28 HISTORY — DX: Benign neoplasm of connective and other soft tissue, unspecified: D21.9

## 2020-01-28 LAB — CBC WITH DIFFERENTIAL/PLATELET
Abs Immature Granulocytes: 0.06 10*3/uL (ref 0.00–0.07)
Basophils Absolute: 0 10*3/uL (ref 0.0–0.1)
Basophils Relative: 0 %
Eosinophils Absolute: 0.2 10*3/uL (ref 0.0–0.5)
Eosinophils Relative: 2 %
HCT: 34.4 % — ABNORMAL LOW (ref 36.0–46.0)
Hemoglobin: 10.8 g/dL — ABNORMAL LOW (ref 12.0–15.0)
Immature Granulocytes: 1 %
Lymphocytes Relative: 14 %
Lymphs Abs: 1.8 10*3/uL (ref 0.7–4.0)
MCH: 27.8 pg (ref 26.0–34.0)
MCHC: 31.4 g/dL (ref 30.0–36.0)
MCV: 88.4 fL (ref 80.0–100.0)
Monocytes Absolute: 1 10*3/uL (ref 0.1–1.0)
Monocytes Relative: 8 %
Neutro Abs: 9.8 10*3/uL — ABNORMAL HIGH (ref 1.7–7.7)
Neutrophils Relative %: 75 %
Platelets: 234 10*3/uL (ref 150–400)
RBC: 3.89 MIL/uL (ref 3.87–5.11)
RDW: 13.8 % (ref 11.5–15.5)
WBC: 13 10*3/uL — ABNORMAL HIGH (ref 4.0–10.5)
nRBC: 0 % (ref 0.0–0.2)

## 2020-01-28 LAB — COMPREHENSIVE METABOLIC PANEL
ALT: 14 U/L (ref 0–44)
AST: 18 U/L (ref 15–41)
Albumin: 3.1 g/dL — ABNORMAL LOW (ref 3.5–5.0)
Alkaline Phosphatase: 18 U/L — ABNORMAL LOW (ref 38–126)
Anion gap: 5 (ref 5–15)
BUN: 11 mg/dL (ref 6–20)
CO2: 25 mmol/L (ref 22–32)
Calcium: 8 mg/dL — ABNORMAL LOW (ref 8.9–10.3)
Chloride: 107 mmol/L (ref 98–111)
Creatinine, Ser: 0.87 mg/dL (ref 0.44–1.00)
GFR calc Af Amer: >60 mL/min (ref >60)
GFR calc non Af Amer: >60 mL/min (ref >60)
Glucose, Bld: 73 mg/dL (ref 70–99)
Potassium: 3.9 mmol/L (ref 3.5–5.1)
Sodium: 137 mmol/L (ref 135–145)
Total Bilirubin: 0.6 mg/dL (ref 0.3–1.2)
Total Protein: 5.6 g/dL — ABNORMAL LOW (ref 6.5–8.1)

## 2020-01-28 LAB — LIPASE, BLOOD: Lipase: 44 U/L (ref 11–51)

## 2020-01-28 LAB — SARS CORONAVIRUS 2 BY RT PCR (HOSPITAL ORDER, PERFORMED IN ~~LOC~~ HOSPITAL LAB): SARS Coronavirus 2: NEGATIVE

## 2020-01-28 LAB — I-STAT BETA HCG BLOOD, ED (MC, WL, AP ONLY): I-stat hCG, quantitative: <5 m[IU]/mL (ref <5)

## 2020-01-28 MED ORDER — SODIUM CHLORIDE 0.9 % IV BOLUS
1000.0000 mL | Freq: Once | INTRAVENOUS | Status: AC
  Administered 2020-01-28: 1000 mL via INTRAVENOUS

## 2020-01-28 MED ORDER — ONDANSETRON 4 MG PO TBDP
4.0000 mg | ORAL_TABLET | Freq: Three times a day (TID) | ORAL | 0 refills | Status: AC | PRN
Start: 2020-01-28 — End: ?

## 2020-01-28 NOTE — ED Triage Notes (Signed)
Pt is here with NVD since Monday. BP 86/40 and pt has had  Bolus of 650 NS en route with EMS.  Complaining of weak, near-syncope, dizzy.  CBG 122

## 2020-01-28 NOTE — ED Notes (Signed)
Pt discharged home per MD order. Discharge summary reviewed with pt, pt verbalizes understanding. Ambulatory off unit. No s/s of acute distress noted. Reports discharge ride home.

## 2020-01-28 NOTE — ED Notes (Signed)
Pt tolerated ortho vs well. Pt denied feeling light headed or dizzy. PA notified of results.

## 2020-01-28 NOTE — Discharge Instructions (Addendum)
Your symptoms could be due to a viral infection.  A COVID-19 test was obtained today.  Please check for the results through MyChart in the next 24 hours.  Take Zofran as needed for nausea.  Return if you have any concern.

## 2020-01-28 NOTE — ED Provider Notes (Signed)
[facility] EMERGENCY DEPARTMENT Provider Note   CSN: [id number] Arrival date & time: 01/28/20  1035     History Chief Complaint  Patient presents with  . Emesis  . Diarrhea    Kristine Barnett is a 29 y.o. female.  The history is provided by the patient. No language interpreter was used.  Emesis Associated symptoms: diarrhea   Diarrhea Associated symptoms: vomiting      29 year old female brought here via EMS from home for evaluation of nausea vomiting diarrhea.  Patient report 5 days ago she accidentally eat food at contains pork that she cannot tolerate.  Since then she has had recurrent nausea, has vomited multiple times, 3 times on the first day and twice on the second day of nonbloody nonbilious vomits as well as having several episodes of loose stools at least 2 or 3 times daily.  She states her appetite has since regained and she is starting to eat more however today she endorsed while stood up she felt lightheadedness and dizziness with room spinning sensation and seeing spots in her vision.  She felt weak and breakout to sweats and subsequently contacted EMS to bring her here.  She denies any significant fever no runny nose sneezing or coughing no loss of taste or smell no significant abdominal pain or dysuria last menstrual period was July 17.  She denies any recent medication changes denies any abnormal bleeding and denies any recent sick contact.  She has not had her Covid vaccination.  She mention when EMS arrived, her blood pressure was low with orthostatic and she did receive IV fluid prior to arrival.  At this time she felt fine.  No past medical history on file.  There are no problems to display for this patient.   No past surgical history on file.   OB History   No obstetric history on file.     No family history on file.  Social History   Tobacco Use  . Smoking status: Never Smoker  . Smokeless tobacco: Never Used  Substance Use Topics    . Alcohol use: Not Currently  . Drug use: Yes    Types: Marijuana    Comment: Daily     Home Medications Prior to Admission medications   Medication Sig Start Date End Date Taking? Authorizing Provider  calcium carbonate (OS-CAL - DOSED IN MG OF ELEMENTAL CALCIUM) 1250 (500 Ca) MG tablet Take 1 tablet by mouth daily.    [provider]  cholecalciferol (VITAMIN D3) 25 MCG (1000 UNIT) tablet Take 1,000 Units by mouth daily.    [provider]  folic acid (FOLVITE) 1 MG tablet Take 1 mg by mouth daily.    [provider]  metroNIDAZOLE (FLAGYL) 500 MG tablet Take 1 tablet (500 mg total) by mouth 2 (two) times daily. Patient taking differently: Take 1,000 mg by mouth daily.  04/04/18   Kinnie Feil, PA-C  ondansetron (ZOFRAN ODT) 4 MG disintegrating tablet Take 1 tablet (4 mg total) by mouth every 8 (eight) hours as needed for nausea or vomiting. Patient not taking: Reported on 07/22/2019 04/04/18   Kinnie Feil, PA-C  Prenatal Vit-Fe Fumarate-FA (PRENATAL MULTIVITAMIN) TABS tablet Take 1 tablet by mouth daily at 12 noon.    [provider]    Allergies    Diclofenac  Review of Systems   Review of Systems  Gastrointestinal: Positive for diarrhea and vomiting.  All other systems reviewed and are negative.   Physical Exam  Updated Vital Signs BP 98/68   Pulse 66   Resp 14   LMP 01/05/2020 (Exact Date)   SpO2 100%   Physical Exam Vitals and nursing note reviewed.  Constitutional:      General: She is not in acute distress.    Appearance: She is well-developed.  HENT:     Head: Atraumatic.     Right Ear: Tympanic membrane normal.     Left Ear: Tympanic membrane normal.  Eyes:     Extraocular Movements: Extraocular movements intact.     Conjunctiva/sclera: Conjunctivae normal.     Pupils: Pupils are equal, round, and reactive to light.  Cardiovascular:     Rate and Rhythm: Normal rate and regular rhythm.     Pulses: Normal  pulses.     Heart sounds: Normal heart sounds.  Pulmonary:     Effort: Pulmonary effort is normal.     Breath sounds: Normal breath sounds. No wheezing, rhonchi or rales.  Abdominal:     Palpations: Abdomen is soft.     Tenderness: There is no abdominal tenderness.  Musculoskeletal:     Cervical back: Neck supple.  Skin:    Findings: No rash.  Neurological:     Mental Status: She is alert and oriented to person, place, and time.     GCS: GCS eye subscore is 4. GCS verbal subscore is 5. GCS motor subscore is 6.  Psychiatric:        Mood and Affect: Mood normal.     ED Results / Procedures / Treatments   Labs (all labs ordered are listed, but only abnormal results are displayed) Labs Reviewed  CBC WITH DIFFERENTIAL/PLATELET - Abnormal; Notable for the following components:      Result Value   WBC 13.0 (*)    Hemoglobin 10.8 (*)    HCT 34.4 (*)    Neutro Abs 9.8 (*)    All other components within normal limits  COMPREHENSIVE METABOLIC PANEL - Abnormal; Notable for the following components:   Calcium 8.0 (*)    Total Protein 5.6 (*)    Albumin 3.1 (*)    Alkaline Phosphatase 18 (*)    All other components within normal limits  LIPASE, BLOOD  URINALYSIS, ROUTINE W REFLEX MICROSCOPIC  I-STAT BETA HCG BLOOD, ED ([facility], [facility], [facility] ONLY)    EKG EKG Interpretation  Date/Time:  Thursday January 28 2020 10:50:34 EDT Ventricular Rate:  75 PR Interval:    QRS Duration: 75 QT Interval:  377 QTC Calculation: 421 R Axis:   85 Text Interpretation: Sinus rhythm when compared to prior, similar apperance. No STEMI Confirmed by Antony Blackbird [phone]) on 01/28/2020 1:22:15 PM   Radiology No results found.  Procedures Procedures (including critical care time)  Medications Ordered in ED Medications  sodium chloride 0.9 % bolus 1,000 mL (1,000 mLs Intravenous New Bag/Given 01/28/20 1110)    ED Course  I have reviewed the triage vital signs and the nursing notes.  Pertinent labs &  imaging results that were available during my care of the patient were reviewed by me and considered in my medical decision making (see chart for details).    MDM Rules/Calculators/A&P                          BP 98/68   Pulse 66   Resp 14   LMP 01/05/2020 (Exact Date)   SpO2 100%   Final Clinical Impression(s) / ED Diagnoses Final diagnoses:  Nausea vomiting and diarrhea  Dehydration    Rx / DC Orders ED Discharge Orders         Ordered    ondansetron (ZOFRAN ODT) 4 MG disintegrating tablet  Every 8 hours PRN     Discontinue  Reprint     01/28/20 1331         10:48 AM Patient with nausea vomiting diarrhea for the past few days now feeling weak and lightheadedness.  She was found to be positive orthostasis on initial exam by EMS.  I suspect her lightheadedness is secondary to decreased p.o. intake and due to nausea vomiting diarrhea for the past few days.  She has a benign abdominal exam.  She has no focal neuro deficit.  Work-up initiated, IV fluid given, will check EKG and orthostatic vital sign.  1:27 PM Mildly elevated white count of 13.0 however she has a fairly benign abdominal exam therefore I have low suspicion for acute abdominal pathology.  At this time patient felt better after receiving symptomatic treatment including IV fluid.  EKG and blood pressure are reassuring.  Will discharge home with antinausea medication and work note as requested.  Return precaution discussed.  Due to viral sxs, a covid-19 test was obtained.  It has not resulted yet.   Kristine Barnett was evaluated in Emergency Department on 01/28/2020 for the symptoms described in the history of present illness. She was evaluated in the context of the global COVID-19 pandemic, which necessitated consideration that the patient might be at risk for infection with the SARS-CoV-2 virus that causes COVID-19. Institutional protocols and algorithms that pertain to the evaluation of patients at risk for COVID-19 are  in a state of rapid change based on information released by regulatory bodies including the CDC and federal and state organizations. These policies and algorithms were followed during the patient's care in the ED.    Domenic Moras, PA-C 01/28/20 1335    Tegeler, Gwenyth Allegra, MD 01/28/20 [phone]

## 2023-01-27 ENCOUNTER — Emergency Department (HOSPITAL_COMMUNITY)
Admission: EM | Admit: 2023-01-27 | Discharge: 2023-01-28 | Disposition: A | Discharge status: Home / Self Care | Payer: Self-pay | Attending: Emergency Medicine | Admitting: Emergency Medicine

## 2023-01-27 ENCOUNTER — Other Ambulatory Visit: Payer: Self-pay

## 2023-01-27 ENCOUNTER — Encounter (HOSPITAL_COMMUNITY): Payer: Self-pay | Admitting: Emergency Medicine

## 2023-01-27 DIAGNOSIS — Y92410 Unspecified street and highway as the place of occurrence of the external cause: Secondary | ICD-10-CM | POA: Diagnosis not present

## 2023-01-27 DIAGNOSIS — M25511 Pain in right shoulder: Secondary | ICD-10-CM | POA: Insufficient documentation

## 2023-01-27 DIAGNOSIS — M25512 Pain in left shoulder: Secondary | ICD-10-CM | POA: Diagnosis not present

## 2023-01-27 DIAGNOSIS — D72829 Elevated white blood cell count, unspecified: Secondary | ICD-10-CM | POA: Insufficient documentation

## 2023-01-27 DIAGNOSIS — S0990XA Unspecified injury of head, initial encounter: Secondary | ICD-10-CM | POA: Diagnosis not present

## 2023-01-27 DIAGNOSIS — S161XXA Strain of muscle, fascia and tendon at neck level, initial encounter: Secondary | ICD-10-CM | POA: Insufficient documentation

## 2023-01-27 DIAGNOSIS — R0789 Other chest pain: Secondary | ICD-10-CM | POA: Diagnosis not present

## 2023-01-27 DIAGNOSIS — S199XXA Unspecified injury of neck, initial encounter: Secondary | ICD-10-CM | POA: Diagnosis present
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
## 2023-01-28 ENCOUNTER — Emergency Department (HOSPITAL_COMMUNITY): Payer: Self-pay

## 2023-01-28 LAB — CBC WITH DIFFERENTIAL/PLATELET
Abs Immature Granulocytes: 0.06 10*3/uL (ref 0.00–0.07)
Basophils Absolute: 0 10*3/uL (ref 0.0–0.1)
Basophils Relative: 0 %
Eosinophils Absolute: 0 10*3/uL (ref 0.0–0.5)
Eosinophils Relative: 0 %
HCT: 40.8 % (ref 36.0–46.0)
Hemoglobin: 13 g/dL (ref 12.0–15.0)
Immature Granulocytes: 0 %
Lymphocytes Relative: 9 %
Lymphs Abs: 1.2 10*3/uL (ref 0.7–4.0)
MCH: 27.3 pg (ref 26.0–34.0)
MCHC: 31.9 g/dL (ref 30.0–36.0)
MCV: 85.5 fL (ref 80.0–100.0)
Monocytes Absolute: 0.9 10*3/uL (ref 0.1–1.0)
Monocytes Relative: 6 %
Neutro Abs: 11.9 10*3/uL — ABNORMAL HIGH (ref 1.7–7.7)
Neutrophils Relative %: 85 %
Platelets: 249 10*3/uL (ref 150–400)
RBC: 4.77 MIL/uL (ref 3.87–5.11)
RDW: 15.9 % — ABNORMAL HIGH (ref 11.5–15.5)
WBC: 14.1 10*3/uL — ABNORMAL HIGH (ref 4.0–10.5)
nRBC: 0 % (ref 0.0–0.2)

## 2023-01-28 LAB — COMPREHENSIVE METABOLIC PANEL
ALT: 19 U/L (ref 0–44)
AST: 24 U/L (ref 15–41)
Albumin: 4 g/dL (ref 3.5–5.0)
Alkaline Phosphatase: 22 U/L — ABNORMAL LOW (ref 38–126)
Anion gap: 15 (ref 5–15)
BUN: 8 mg/dL (ref 6–20)
CO2: 23 mmol/L (ref 22–32)
Calcium: 8.8 mg/dL — ABNORMAL LOW (ref 8.9–10.3)
Chloride: 99 mmol/L (ref 98–111)
Creatinine, Ser: 0.9 mg/dL (ref 0.44–1.00)
GFR, Estimated: >60 mL/min (ref >60)
Glucose, Bld: 72 mg/dL (ref 70–99)
Potassium: 3.5 mmol/L (ref 3.5–5.1)
Sodium: 137 mmol/L (ref 135–145)
Total Bilirubin: 0.6 mg/dL (ref 0.3–1.2)
Total Protein: 7.5 g/dL (ref 6.5–8.1)

## 2023-01-28 LAB — PREGNANCY, URINE: Preg Test, Ur: NEGATIVE

## 2023-01-28 LAB — PROTIME-INR
INR: 1 (ref 0.8–1.2)
Prothrombin Time: 13.4 seconds (ref 11.4–15.2)

## 2023-01-28 MED ORDER — MORPHINE SULFATE (PF) 4 MG/ML IV SOLN
4.0000 mg | Freq: Once | INTRAVENOUS | Status: AC
  Administered 2023-01-28: 4 mg via INTRAVENOUS
  Filled 2023-01-28: qty 1

## 2023-01-28 MED ORDER — IBUPROFEN 800 MG PO TABS: 800.0000 mg | ORAL_TABLET | Freq: Three times a day (TID) | ORAL | 0 refills | Status: AC | PRN

## 2023-01-28 MED ORDER — ONDANSETRON HCL 4 MG/2ML IJ SOLN
4.0000 mg | Freq: Once | INTRAMUSCULAR | Status: AC
  Administered 2023-01-28: 4 mg via INTRAVENOUS
  Filled 2023-01-28: qty 2

## 2023-01-28 MED ORDER — SODIUM CHLORIDE 0.9 % IV BOLUS
500.0000 mL | Freq: Once | INTRAVENOUS | Status: AC
  Administered 2023-01-28: 500 mL via INTRAVENOUS

## 2023-01-28 MED ORDER — IOHEXOL 350 MG/ML SOLN
75.0000 mL | Freq: Once | INTRAVENOUS | Status: AC | PRN
  Administered 2023-01-28: 75 mL via INTRAVENOUS

## 2023-01-28 MED ORDER — METHOCARBAMOL 500 MG PO TABS: 500.0000 mg | ORAL_TABLET | Freq: Three times a day (TID) | ORAL | 0 refills | Status: AC | PRN

## 2023-01-28 NOTE — Discharge Instructions (Signed)
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
## 2023-01-28 NOTE — ED Notes (Signed)
C-Collar removed per [place], [place] - CT neck resulted
